# Patient Record
Sex: Male | Born: 1935 | Race: White | Marital: Married | Smoking: Never smoker
Health system: Southern US, Community
[De-identification: ages and names within clinical notes are randomized; demographics above are authoritative.]

## PROBLEM LIST (undated history)

## (undated) DIAGNOSIS — I10 Essential (primary) hypertension: Secondary | ICD-10-CM

## (undated) DIAGNOSIS — I509 Heart failure, unspecified: Secondary | ICD-10-CM

## (undated) DIAGNOSIS — E079 Disorder of thyroid, unspecified: Secondary | ICD-10-CM

## (undated) HISTORY — PX: FOOT FUSION: SHX956

## (undated) HISTORY — PX: CHOLECYSTECTOMY: SHX55

## (undated) HISTORY — PX: CATARACT EXTRACTION: SUR2

## (undated) HISTORY — PX: HEMORROIDECTOMY: SUR656

## (undated) HISTORY — PX: HERNIA REPAIR: SHX51

## (undated) HISTORY — PX: TONSILLECTOMY: SUR1361

## (undated) HISTORY — PX: PROSTATECTOMY: SHX69

---

## 2015-11-19 ENCOUNTER — Encounter (HOSPITAL_COMMUNITY): Payer: Self-pay

## 2015-11-19 ENCOUNTER — Emergency Department (HOSPITAL_COMMUNITY): Payer: Medicare HMO

## 2015-11-19 ENCOUNTER — Inpatient Hospital Stay (HOSPITAL_COMMUNITY)
Admission: EM | Admit: 2015-11-19 | Discharge: 2015-11-20 | DRG: 682 | Disposition: A | Discharge status: Home / Self Care | Payer: Medicare HMO | Attending: Internal Medicine | Admitting: Internal Medicine

## 2015-11-19 ENCOUNTER — Inpatient Hospital Stay (HOSPITAL_COMMUNITY)
Admit: 2015-11-19 | Discharge: 2015-11-19 | Disposition: A | Discharge status: Home / Self Care | Payer: Medicare HMO | Attending: Internal Medicine | Admitting: Internal Medicine

## 2015-11-19 DIAGNOSIS — Z66 Do not resuscitate: Secondary | ICD-10-CM | POA: Diagnosis present

## 2015-11-19 DIAGNOSIS — R609 Edema, unspecified: Secondary | ICD-10-CM | POA: Diagnosis not present

## 2015-11-19 DIAGNOSIS — J9811 Atelectasis: Secondary | ICD-10-CM | POA: Diagnosis present

## 2015-11-19 DIAGNOSIS — E860 Dehydration: Secondary | ICD-10-CM | POA: Diagnosis present

## 2015-11-19 DIAGNOSIS — N189 Chronic kidney disease, unspecified: Secondary | ICD-10-CM | POA: Diagnosis present

## 2015-11-19 DIAGNOSIS — K5900 Constipation, unspecified: Secondary | ICD-10-CM | POA: Diagnosis present

## 2015-11-19 DIAGNOSIS — E890 Postprocedural hypothyroidism: Secondary | ICD-10-CM | POA: Diagnosis present

## 2015-11-19 DIAGNOSIS — N179 Acute kidney failure, unspecified: Secondary | ICD-10-CM | POA: Diagnosis not present

## 2015-11-19 DIAGNOSIS — R4182 Altered mental status, unspecified: Secondary | ICD-10-CM | POA: Diagnosis present

## 2015-11-19 DIAGNOSIS — R7989 Other specified abnormal findings of blood chemistry: Secondary | ICD-10-CM | POA: Diagnosis not present

## 2015-11-19 DIAGNOSIS — Z79899 Other long term (current) drug therapy: Secondary | ICD-10-CM

## 2015-11-19 DIAGNOSIS — F0280 Dementia in other diseases classified elsewhere without behavioral disturbance: Secondary | ICD-10-CM | POA: Diagnosis present

## 2015-11-19 DIAGNOSIS — R778 Other specified abnormalities of plasma proteins: Secondary | ICD-10-CM | POA: Diagnosis present

## 2015-11-19 DIAGNOSIS — I130 Hypertensive heart and chronic kidney disease with heart failure and stage 1 through stage 4 chronic kidney disease, or unspecified chronic kidney disease: Secondary | ICD-10-CM | POA: Diagnosis present

## 2015-11-19 DIAGNOSIS — Z9049 Acquired absence of other specified parts of digestive tract: Secondary | ICD-10-CM

## 2015-11-19 DIAGNOSIS — G309 Alzheimer's disease, unspecified: Secondary | ICD-10-CM | POA: Diagnosis present

## 2015-11-19 DIAGNOSIS — E872 Acidosis, unspecified: Secondary | ICD-10-CM

## 2015-11-19 DIAGNOSIS — R000 Tachycardia, unspecified: Secondary | ICD-10-CM | POA: Diagnosis present

## 2015-11-19 DIAGNOSIS — Z7982 Long term (current) use of aspirin: Secondary | ICD-10-CM | POA: Diagnosis not present

## 2015-11-19 DIAGNOSIS — I482 Chronic atrial fibrillation: Secondary | ICD-10-CM | POA: Diagnosis present

## 2015-11-19 DIAGNOSIS — R748 Abnormal levels of other serum enzymes: Secondary | ICD-10-CM | POA: Diagnosis present

## 2015-11-19 DIAGNOSIS — G9340 Encephalopathy, unspecified: Secondary | ICD-10-CM

## 2015-11-19 DIAGNOSIS — Z951 Presence of aortocoronary bypass graft: Secondary | ICD-10-CM

## 2015-11-19 DIAGNOSIS — I2510 Atherosclerotic heart disease of native coronary artery without angina pectoris: Secondary | ICD-10-CM | POA: Diagnosis present

## 2015-11-19 DIAGNOSIS — I480 Paroxysmal atrial fibrillation: Secondary | ICD-10-CM | POA: Diagnosis not present

## 2015-11-19 DIAGNOSIS — M7989 Other specified soft tissue disorders: Secondary | ICD-10-CM

## 2015-11-19 DIAGNOSIS — R600 Localized edema: Secondary | ICD-10-CM

## 2015-11-19 DIAGNOSIS — Z9181 History of falling: Secondary | ICD-10-CM | POA: Diagnosis not present

## 2015-11-19 DIAGNOSIS — I10 Essential (primary) hypertension: Secondary | ICD-10-CM | POA: Diagnosis present

## 2015-11-19 DIAGNOSIS — R531 Weakness: Secondary | ICD-10-CM

## 2015-11-19 DIAGNOSIS — I509 Heart failure, unspecified: Secondary | ICD-10-CM | POA: Diagnosis present

## 2015-11-19 DIAGNOSIS — R410 Disorientation, unspecified: Secondary | ICD-10-CM

## 2015-11-19 HISTORY — DX: Heart failure, unspecified: I50.9

## 2015-11-19 HISTORY — DX: Disorder of thyroid, unspecified: E07.9

## 2015-11-19 HISTORY — DX: Essential (primary) hypertension: I10

## 2015-11-19 LAB — URINALYSIS, ROUTINE W REFLEX MICROSCOPIC
Bilirubin Urine: NEGATIVE
GLUCOSE, UA: NEGATIVE mg/dL
Hgb urine dipstick: NEGATIVE
Ketones, ur: NEGATIVE mg/dL
LEUKOCYTES UA: NEGATIVE
Nitrite: NEGATIVE
PH: 5 (ref 5.0–8.0)
PROTEIN: 30 mg/dL — AB
Specific Gravity, Urine: 1.025 (ref 1.005–1.030)

## 2015-11-19 LAB — CBC
HCT: 38.4 % — ABNORMAL LOW (ref 39.0–52.0)
Hemoglobin: 12.2 g/dL — ABNORMAL LOW (ref 13.0–17.0)
MCH: 31.9 pg (ref 26.0–34.0)
MCHC: 31.8 g/dL (ref 30.0–36.0)
MCV: 100.3 fL — AB (ref 78.0–100.0)
PLATELETS: 263 10*3/uL (ref 150–400)
RBC: 3.83 MIL/uL — ABNORMAL LOW (ref 4.22–5.81)
RDW: 14.3 % (ref 11.5–15.5)
WBC: 10.9 10*3/uL — ABNORMAL HIGH (ref 4.0–10.5)

## 2015-11-19 LAB — TROPONIN I
Troponin I: 1.27 ng/mL (ref <0.03)
Troponin I: 1.2 ng/mL (ref <0.03)

## 2015-11-19 LAB — COMPREHENSIVE METABOLIC PANEL
ALBUMIN: 3.5 g/dL (ref 3.5–5.0)
ALK PHOS: 124 U/L (ref 38–126)
ALT: 20 U/L (ref 17–63)
ANION GAP: 14 (ref 5–15)
AST: 33 U/L (ref 15–41)
BILIRUBIN TOTAL: 0.9 mg/dL (ref 0.3–1.2)
BUN: 32 mg/dL — ABNORMAL HIGH (ref 6–20)
CALCIUM: 9.2 mg/dL (ref 8.9–10.3)
CO2: 25 mmol/L (ref 22–32)
Chloride: 99 mmol/L — ABNORMAL LOW (ref 101–111)
Creatinine, Ser: 1.72 mg/dL — ABNORMAL HIGH (ref 0.61–1.24)
GFR calc non Af Amer: 33 mL/min — ABNORMAL LOW (ref >60)
GFR, EST AFRICAN AMERICAN: 38 mL/min — AB (ref >60)
Glucose, Bld: 141 mg/dL — ABNORMAL HIGH (ref 65–99)
POTASSIUM: 4.1 mmol/L (ref 3.5–5.1)
Sodium: 138 mmol/L (ref 135–145)
Total Protein: 7.5 g/dL (ref 6.5–8.1)

## 2015-11-19 LAB — I-STAT TROPONIN, ED: Troponin i, poc: 0.68 ng/mL (ref 0.00–0.08)

## 2015-11-19 LAB — LACTIC ACID, PLASMA
LACTIC ACID, VENOUS: 2.3 mmol/L — AB (ref 0.5–1.9)
LACTIC ACID, VENOUS: 2.6 mmol/L — AB (ref 0.5–1.9)

## 2015-11-19 LAB — URINE MICROSCOPIC-ADD ON

## 2015-11-19 LAB — TSH: TSH: 1.216 u[IU]/mL (ref 0.350–4.500)

## 2015-11-19 LAB — I-STAT CG4 LACTIC ACID, ED: Lactic Acid, Venous: 3.28 mmol/L (ref 0.5–1.9)

## 2015-11-19 LAB — BRAIN NATRIURETIC PEPTIDE: B Natriuretic Peptide: 492.6 pg/mL — ABNORMAL HIGH (ref 0.0–100.0)

## 2015-11-19 LAB — CBG MONITORING, ED: Glucose-Capillary: 134 mg/dL — ABNORMAL HIGH (ref 65–99)

## 2015-11-19 MED ORDER — LEVOTHYROXINE SODIUM 25 MCG PO TABS
125.0000 ug | ORAL_TABLET | Freq: Every day | ORAL | Status: DC
  Administered 2015-11-20: 125 ug via ORAL
  Filled 2015-11-19: qty 1

## 2015-11-19 MED ORDER — SODIUM CHLORIDE 0.9 % IV BOLUS (SEPSIS)
500.0000 mL | Freq: Once | INTRAVENOUS | Status: AC
  Administered 2015-11-19: 500 mL via INTRAVENOUS

## 2015-11-19 MED ORDER — ASPIRIN EC 81 MG PO TBEC
81.0000 mg | DELAYED_RELEASE_TABLET | Freq: Every day | ORAL | Status: DC
  Filled 2015-11-19: qty 1

## 2015-11-19 MED ORDER — LATANOPROST 0.005 % OP SOLN
1.0000 [drp] | Freq: Every day | OPHTHALMIC | Status: DC
  Administered 2015-11-19: 1 [drp] via OPHTHALMIC
  Filled 2015-11-19: qty 2.5

## 2015-11-19 MED ORDER — METOPROLOL TARTRATE 5 MG/5ML IV SOLN
2.5000 mg | Freq: Once | INTRAVENOUS | Status: DC
  Filled 2015-11-19: qty 5

## 2015-11-19 MED ORDER — POLYETHYLENE GLYCOL 3350 17 G PO PACK
8.5000 g | PACK | Freq: Every day | ORAL | Status: DC
  Administered 2015-11-19 – 2015-11-20 (×2): 8.5 g via ORAL
  Filled 2015-11-19 (×2): qty 1

## 2015-11-19 MED ORDER — METOPROLOL TARTRATE 50 MG PO TABS
50.0000 mg | ORAL_TABLET | Freq: Two times a day (BID) | ORAL | Status: DC
  Administered 2015-11-19 – 2015-11-20 (×2): 50 mg via ORAL
  Filled 2015-11-19 (×2): qty 1

## 2015-11-19 MED ORDER — ASPIRIN 81 MG PO CHEW
81.0000 mg | CHEWABLE_TABLET | Freq: Every day | ORAL | Status: DC
  Administered 2015-11-19 – 2015-11-20 (×2): 81 mg via ORAL
  Filled 2015-11-19 (×2): qty 1

## 2015-11-19 MED ORDER — SODIUM CHLORIDE 0.9 % IV SOLN
INTRAVENOUS | Status: DC
  Administered 2015-11-19: 12:00:00 via INTRAVENOUS

## 2015-11-19 MED ORDER — ENOXAPARIN SODIUM 30 MG/0.3ML ~~LOC~~ SOLN
30.0000 mg | SUBCUTANEOUS | Status: DC
  Administered 2015-11-19: 30 mg via SUBCUTANEOUS
  Filled 2015-11-19: qty 0.3

## 2015-11-19 MED ORDER — METOPROLOL TARTRATE 25 MG PO TABS
25.0000 mg | ORAL_TABLET | Freq: Once | ORAL | Status: AC
  Administered 2015-11-19: 25 mg via ORAL
  Filled 2015-11-19: qty 1

## 2015-11-19 NOTE — Consult Note (Signed)
Reason for Consult: Elevated Troponin   Referring Physician: Dr. Aris Georgia Alfrey is an 82 y.o. male.   HPI: The patient is a very pleasantly demented 82 year old man who was admitted to the hospital with dehydration and altered mentation. Subsequent laboratory evaluation demonstrated an elevated troponin. We are asked to see and evaluate. He denies anginal symptoms. He is not short of breath. He has chronic atrial fibrillation, but is not thought to be a candidate for systemic anticoagulation. He has a history of falls and dementia. The patient has improved since hospitalization with IV hydration. No shortness of breath. No syncope. No palpitations.  PMH: Past Medical History:  Diagnosis Date  . CHF (congestive heart failure) (HCC)   . Hypertension   . Thyroid disease     PSHX: Past Surgical History:  Procedure Laterality Date  . CATARACT EXTRACTION    . CHOLECYSTECTOMY    . FOOT FUSION    . HEMORROIDECTOMY    . HERNIA REPAIR    . PROSTATECTOMY    . TONSILLECTOMY      FAMHX:No family history on file. No premature coronary disease Social History:  reports that he has never smoked. He has never used smokeless tobacco. He reports that he does not drink alcohol or use drugs.  Allergies: No Known Allergies  Medications: I have reviewed the patient's current medications.  Dg Chest 2 View  Result Date: 11/19/2015 CLINICAL DATA:  Patient with altered mental status. Recent car travel. EXAM: CHEST  2 VIEW COMPARISON:  None. FINDINGS: Monitoring leads overlie the patient. Cardiomegaly. Tortuosity and calcification of the thoracic aorta. Biapical pleural parenchymal thickening, right greater than left. Small bilateral pleural effusions, left-greater-than-right with underlying pulmonary opacities. Mid thoracic spine degenerative changes. IMPRESSION: Small bilateral pleural effusions, left-greater-than-right with underlying opacities favored represent atelectasis. Infection not  excluded. Aortic vascular calcifications. Electronically Signed   By: Annia Belt M.D.   On: 11/19/2015 08:13    ROS  As stated in the HPI and negative for all other systems.  Physical Exam  Vitals:Blood pressure (!) 140/97, pulse (!) 107, temperature 97.6 F (36.4 C), temperature source Axillary, resp. rate (!) 21, height 5\' 8"  (1.727 m), weight 130 lb 3.2 oz (59.1 kg), SpO2 94 %.  Elderly appearing 82 year old man, NAD HEENT: Unremarkable Neck:  7 cm JVD, no thyromegally Back:  No CVA tenderness Lungs:  Clear except for rales in the bases. No wheezes or rhonchi. HEART:  Regular rate rhythm, no murmurs, no rubs, no clicks Abd:  Soft, positive bowel sounds, no organomegally, no rebound, no guarding Ext:  2 plus pulses, trace peripheral edema, no cyanosis, no clubbing Skin:  No rashes no nodules Neuro:  CN II through XII intact, motor grossly intact  ECG - atrial fibrillation with a rapid ventricular response and QRS widening  Labs - reviewed  Chest x-ray - reviewed - atelectasis is present bilaterally  Telemetry - atrial fibrillation with a controlled ventricular response  Assessment/Plan: 1. Elevated troponin - the patient has no signs or symptoms of an acute myocardial infarction and has no diagnostic ECG changes. At the present time, there is no additional evaluation needed. I would recommend low-dose aspirin and beta blocker therapy. Would not recommend an echo, a stress test, or heart catheterization as the patient is not symptomatic and we would make no additional changes to his treatment with his advanced age and other comorbidities with the data collected. 2. Atrial fibrillation - continue aspirin. He is not a candidate for systemic  anticoagulation. Monitor his heart rate and treat his rapid ventricular rates if any with beta blocker therapy. 3. Dementia - he is pleasantly demented and answers questions appropriately.   Sharlot GowdaGregg TaylorMD 11/19/2015, 2:45 PM

## 2015-11-19 NOTE — ED Notes (Signed)
RN AND DR.PLUNKETT NOTIFIED OF PATIENT'S TROPONIN OF 0.68 AND LACTIC ACID LEVEL OF 3.28

## 2015-11-19 NOTE — ED Notes (Signed)
Patient and wife are aware we need a urine specimen, patient given coffee.

## 2015-11-19 NOTE — ED Provider Notes (Addendum)
[facility]-EMERGENCY DEPT Provider Note   CSN: [id number] Arrival date & time: 11/19/15  4696     History   Chief Complaint Chief Complaint  Patient presents with  . Altered Mental Status    HPI Adam Ashley is a 82 y.o. male.  Patient is a 82 year old male with a history with his family for altered mental status. Patient is originally a resident of [place] and was currently in the car yesterday for 12 hours due to a mandatory evacuation. Patient's wife states they only stopped 3 times and had nothing to eat since breakfast because they were in bumper to bumper traffic. When they got to the hotel last night he seemed much more confused and refused to lay in the bed. He refused to take his medication or to eat. This morning symptoms were only worse and they decided to bring him here. Wife states at baseline he has dementia but is always is oriented to person and familiar people. He never refuses to eat or take his medication.  She also states that he is extremely generally weak today where she was unable to get him out of bed and get him to the bathroom. Normally he transfers and rides in a wheelchair. Today patient's only complaint is feeling weak. He denies nausea, shortness of breath, chest pain, abdominal pain or diarrhea.      Past Medical History:  Diagnosis Date  . CHF (congestive heart failure) (HCC)   . Hypertension   . Thyroid disease     There are no active problems to display for this patient.   Past Surgical History:  Procedure Laterality Date  . CATARACT EXTRACTION    . CHOLECYSTECTOMY    . FOOT FUSION    . HEMORROIDECTOMY    . HERNIA REPAIR    . PROSTATECTOMY    . TONSILLECTOMY         Home Medications    Prior to Admission medications   Not on File    Family History No family history on file.  Social History Social History  Substance Use Topics  . Smoking status: Never Smoker  . Smokeless tobacco: Never Used  . Alcohol use No      Allergies   Review of patient's allergies indicates no known allergies.   Review of Systems Review of Systems  All other systems reviewed and are negative.    Physical Exam Updated Vital Signs BP 134/94 (BP Location: Right Arm)   Pulse 117   Temp 97.9 F (36.6 C) (Oral)   Resp 24   Ht 5\' 7"  (1.702 m)   Wt 135 lb (61.2 kg)   SpO2 100%   BMI 21.14 kg/m   Physical Exam  Constitutional: He appears well-developed. He appears listless. He appears cachectic. No distress.  HENT:  Head: Normocephalic and atraumatic.  Mouth/Throat: Mucous membranes are dry.  Eyes: Conjunctivae and EOM are normal. Pupils are equal, round, and reactive to light.  Neck: Normal range of motion. Neck supple.  Cardiovascular: Intact distal pulses.  An irregularly irregular rhythm present. Tachycardia present.   No murmur heard. Pulmonary/Chest: Effort normal and breath sounds normal. No respiratory distress. He has no wheezes. He has no rales.  Abdominal: Soft. He exhibits no distension. There is no tenderness. There is no rebound and no guarding.  Musculoskeletal: Normal range of motion. He exhibits edema. He exhibits no tenderness.  2+ distal edema bilaterally to the mid shin  Neurological: He appears listless. No cranial nerve deficit or sensory deficit.  Oriented to person.  Moving all ext  Skin: Skin is warm and dry. No rash noted. No erythema.  Psychiatric: He has a normal mood and affect. His behavior is normal.  Nursing note and vitals reviewed.    ED Treatments / Results  Labs (all labs ordered are listed, but only abnormal results are displayed) Labs Reviewed  COMPREHENSIVE METABOLIC PANEL - Abnormal; Notable for the following:       Result Value   Chloride 99 (*)    Glucose, Bld 141 (*)    BUN 32 (*)    Creatinine, Ser 1.72 (*)    GFR calc non Af Amer 33 (*)    GFR calc Af Amer 38 (*)    All other components within normal limits  CBC - Abnormal; Notable for the following:     WBC 10.9 (*)    RBC 3.83 (*)    Hemoglobin 12.2 (*)    HCT 38.4 (*)    MCV 100.3 (*)    All other components within normal limits  CBG MONITORING, ED - Abnormal; Notable for the following:    Glucose-Capillary 134 (*)    All other components within normal limits  I-STAT TROPOININ, ED - Abnormal; Notable for the following:    Troponin i, poc 0.68 (*)    All other components within normal limits  I-STAT CG4 LACTIC ACID, ED - Abnormal; Notable for the following:    Lactic Acid, Venous 3.28 (*)    All other components within normal limits  URINALYSIS, ROUTINE W REFLEX MICROSCOPIC (NOT AT [facility])    EKG  EKG Interpretation  Date/Time:  Saturday November 19 2015 06:49:40 EDT Ventricular Rate:  121 PR Interval:    QRS Duration: 130 QT Interval:  364 QTC Calculation: 517 R Axis:   -44 Text Interpretation:  Atrial fibrillation Nonspecific IVCD with LAD No previous tracing Confirmed by Anitra LauthPLUNKETT  MD, Alphonzo LemmingsWHITNEY ([id number]) on 11/19/2015 7:07:03 AM       Radiology Dg Chest 2 View  Result Date: 11/19/2015 CLINICAL DATA:  Patient with altered mental status. Recent car travel. EXAM: CHEST  2 VIEW COMPARISON:  None. FINDINGS: Monitoring leads overlie the patient. Cardiomegaly. Tortuosity and calcification of the thoracic aorta. Biapical pleural parenchymal thickening, right greater than left. Small bilateral pleural effusions, left-greater-than-right with underlying pulmonary opacities. Mid thoracic spine degenerative changes. IMPRESSION: Small bilateral pleural effusions, left-greater-than-right with underlying opacities favored represent atelectasis. Infection not excluded. Aortic vascular calcifications. Electronically Signed   By: Annia Beltrew  Davis M.D.   On: 11/19/2015 08:13    Procedures Procedures (including critical care time)  Medications Ordered in ED Medications  metoprolol (LOPRESSOR) injection 2.5 mg (not administered)  sodium chloride 0.9 % bolus 500 mL (not administered)     Initial  Impression / Assessment and Plan / ED Course  I have reviewed the triage vital signs and the nursing notes.  Pertinent labs & imaging results that were available during my care of the patient were reviewed by me and considered in my medical decision making (see chart for details).  Clinical Course   Patient is a 82 year old male presenting today with family for altered mental status. He is originally from [place] and was in the car for 12 hours yesterday related to a mandatory evacuation. His wife states he has not had his medication in approximately 24 hours and is eaten only 1-2 bites of food. He is refusing to eat or take his medication and has been more confused. He does have a  baseline dementia but is never this bad. Patient states he just feels weak today but is in no acute distress. He denies shortness of breath or chest pain. Patient is tachycardic on exam today with atrial fibrillation. His wife states he has had that before but was not aware that he was in it regularly. Again tachycardia may be related to rebound as patient is supposed to be on metoprolol twice a day and has not taken any in 24 hours. He denies any abdominal pain and swelling in his distal extremities is slightly better than baseline per his wife. Currently blood pressure is normal 134/94. He is afebrile but slightly tachypnea. Concern for potential dehydration as patient has not had anything in the last 24 hours to eat or drink as well as rebound tachycardia from not having his metoprolol which may also be adding into his symptoms. Also patient's dementia may be worse due to the fact that they had to leave quickly and in a different place than normal.  CBC, CMP, troponin, chest x-ray, UA, d-dimer pending. Patient given gentle fluid. Also given 2.5 metoprolol IV to help with his tachycardia. EKG shows atrial fibrillation with an indeterminate conduction delay. Patient has never been here before we have no old to  compare.  9:10 AM Patient's lactate was found to be 3.28 today with a mild troponin leak 0.68 which is most likely from A. fib RVR. After 1 L of IV fluids patient seems to be improving. Heart rate improved to the low 100s and he was given his oral metoprolol. Blood pressure remained stable. May have mild AK I did a with a creatinine of 1.7 however unclear what his baseline is but wife states he's never had problems before. UA still pending but will admit patient for further care.  Final Clinical Impressions(s) / ED Diagnoses   Final diagnoses:  Delirium  Dehydration    New Prescriptions New Prescriptions   No medications on file     Gwyneth Sprout, MD 11/19/15 1610    Gwyneth Sprout, MD 11/19/15 [phone]

## 2015-11-19 NOTE — Progress Notes (Signed)
*  Preliminary Results* Bilateral lower extremity venous duplex completed. Study was technically limited due to edema and patient position. Visualized veins of bilateral lower extremities are negative for deep vein thrombosis. Bilateral lower extremity veins exhibit pulsatile flow, suggestive of possible elevated right sided heart pressure. There is evidence of a right Baker's cyst. No evidence of left Baker's cyst.  11/19/2015 11:43 AM Gertie FeyMichelle Haydan Mansouri, BS, RVT, RDCS, RDMS

## 2015-11-19 NOTE — Progress Notes (Signed)
CRITICAL VALUE ALERT  Critical value received:  Trop 1.27, Lactic 2.3  Date of notification: 11/19/15  Time of notification:  1330  Critical value read back:Yes.    Nurse who received alert:  Beaulah Corinana Lakeyn Dokken  MD notified (1st page):  Gherghe  Time of first page:  1330  MD notified (2nd page):  Time of second page:  Responding MD:  Elvera LennoxGherghe  Time MD responded:  1345

## 2015-11-19 NOTE — ED Triage Notes (Signed)
Patient presents today with wife who states that his mental status has become altered.  Patient and wife traveled in the car 12 hours yesterday during the mandatory evacuation.  Patient wife states that he was "normal" yesterday but, when they arrived at the hotel last night he was not sure where he was and that he wanted to leave.  Patient denies chest pain or SOB.
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
## 2015-11-20 DIAGNOSIS — E872 Acidosis, unspecified: Secondary | ICD-10-CM

## 2015-11-20 DIAGNOSIS — E860 Dehydration: Secondary | ICD-10-CM

## 2015-11-20 DIAGNOSIS — R7989 Other specified abnormal findings of blood chemistry: Secondary | ICD-10-CM

## 2015-11-20 DIAGNOSIS — I2510 Atherosclerotic heart disease of native coronary artery without angina pectoris: Secondary | ICD-10-CM

## 2015-11-20 DIAGNOSIS — I480 Paroxysmal atrial fibrillation: Secondary | ICD-10-CM

## 2015-11-20 DIAGNOSIS — G9340 Encephalopathy, unspecified: Secondary | ICD-10-CM

## 2015-11-20 DIAGNOSIS — N179 Acute kidney failure, unspecified: Principal | ICD-10-CM

## 2015-11-20 DIAGNOSIS — I10 Essential (primary) hypertension: Secondary | ICD-10-CM

## 2015-11-20 DIAGNOSIS — R531 Weakness: Secondary | ICD-10-CM

## 2015-11-20 DIAGNOSIS — R600 Localized edema: Secondary | ICD-10-CM

## 2015-11-20 LAB — CBC
HCT: 34 % — ABNORMAL LOW (ref 39.0–52.0)
Hemoglobin: 11.1 g/dL — ABNORMAL LOW (ref 13.0–17.0)
MCH: 32.3 pg (ref 26.0–34.0)
MCHC: 32.6 g/dL (ref 30.0–36.0)
MCV: 98.8 fL (ref 78.0–100.0)
PLATELETS: 224 10*3/uL (ref 150–400)
RBC: 3.44 MIL/uL — AB (ref 4.22–5.81)
RDW: 14.2 % (ref 11.5–15.5)
WBC: 8.9 10*3/uL (ref 4.0–10.5)

## 2015-11-20 LAB — URINE CULTURE: Culture: 1000 — AB

## 2015-11-20 LAB — BASIC METABOLIC PANEL
Anion gap: 8 (ref 5–15)
BUN: 28 mg/dL — AB (ref 6–20)
CALCIUM: 8.5 mg/dL — AB (ref 8.9–10.3)
CHLORIDE: 102 mmol/L (ref 101–111)
CO2: 27 mmol/L (ref 22–32)
CREATININE: 1.44 mg/dL — AB (ref 0.61–1.24)
GFR calc non Af Amer: 40 mL/min — ABNORMAL LOW (ref >60)
GFR, EST AFRICAN AMERICAN: 47 mL/min — AB (ref >60)
Glucose, Bld: 96 mg/dL (ref 65–99)
Potassium: 3.8 mmol/L (ref 3.5–5.1)
SODIUM: 137 mmol/L (ref 135–145)

## 2015-11-20 LAB — TROPONIN I: Troponin I: 1.03 ng/mL (ref <0.03)

## 2015-11-20 MED ORDER — T.E.D. BELOW KNEE/L-REGULAR MISC: 1.0000 | Freq: Every day | 0 refills | Status: AC

## 2015-11-20 MED ORDER — POTASSIUM CHLORIDE CRYS ER 20 MEQ PO TBCR: 20.0000 meq | EXTENDED_RELEASE_TABLET | Freq: Every day | ORAL | 0 refills | Status: AC | PRN

## 2015-11-20 MED ORDER — FUROSEMIDE 40 MG PO TABS: 40.0000 mg | ORAL_TABLET | Freq: Every day | ORAL | 0 refills | Status: AC

## 2015-11-20 NOTE — Discharge Instructions (Signed)
Try to avoid using Lasix unless swelling in legs severe. Wear compression stockings and keep legs elevated as much as possible to prevent swelling. Potassium should be taken only when you are taking Lasix.  Please take all your medications with you for your next visit with your Primary MD. Please request your Primary MD to go over all hospital test results at the follow up. Please ask your Primary MD to get all Hospital records sent to his/her office.  If you experience worsening of your admission symptoms, develop shortness of breath, chest pain, suicidal or homicidal thoughts or a life threatening emergency, you must seek medical attention immediately by calling 911 or calling your MD.  Bonita QuinYou must read the complete instructions/literature along with all the possible adverse reactions/side effects for all the medicines you take including new medications that have been prescribed to you. Take new medicines after you have completely understood and accpet all the possible adverse reactions/side effects.   Do not drive when taking pain medications or sedatives.    Do not take more than prescribed Pain, Sleep and Anxiety Medications  If you have smoked or chewed Tobacco in the last 2 yrs please stop. Stop any regular alcohol and or recreational drug use.  Wear Seat belts while driving.
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
## 2015-12-11 DEATH — deceased

## 2017-11-24 IMAGING — CR DG CHEST 2V
2 series · 2 of 2 positions shown · non-contrast
Comparison: None.

CLINICAL DATA: Patient with altered mental status. Recent car
travel.

EXAM:
CHEST  2 VIEW

[w chest lat]
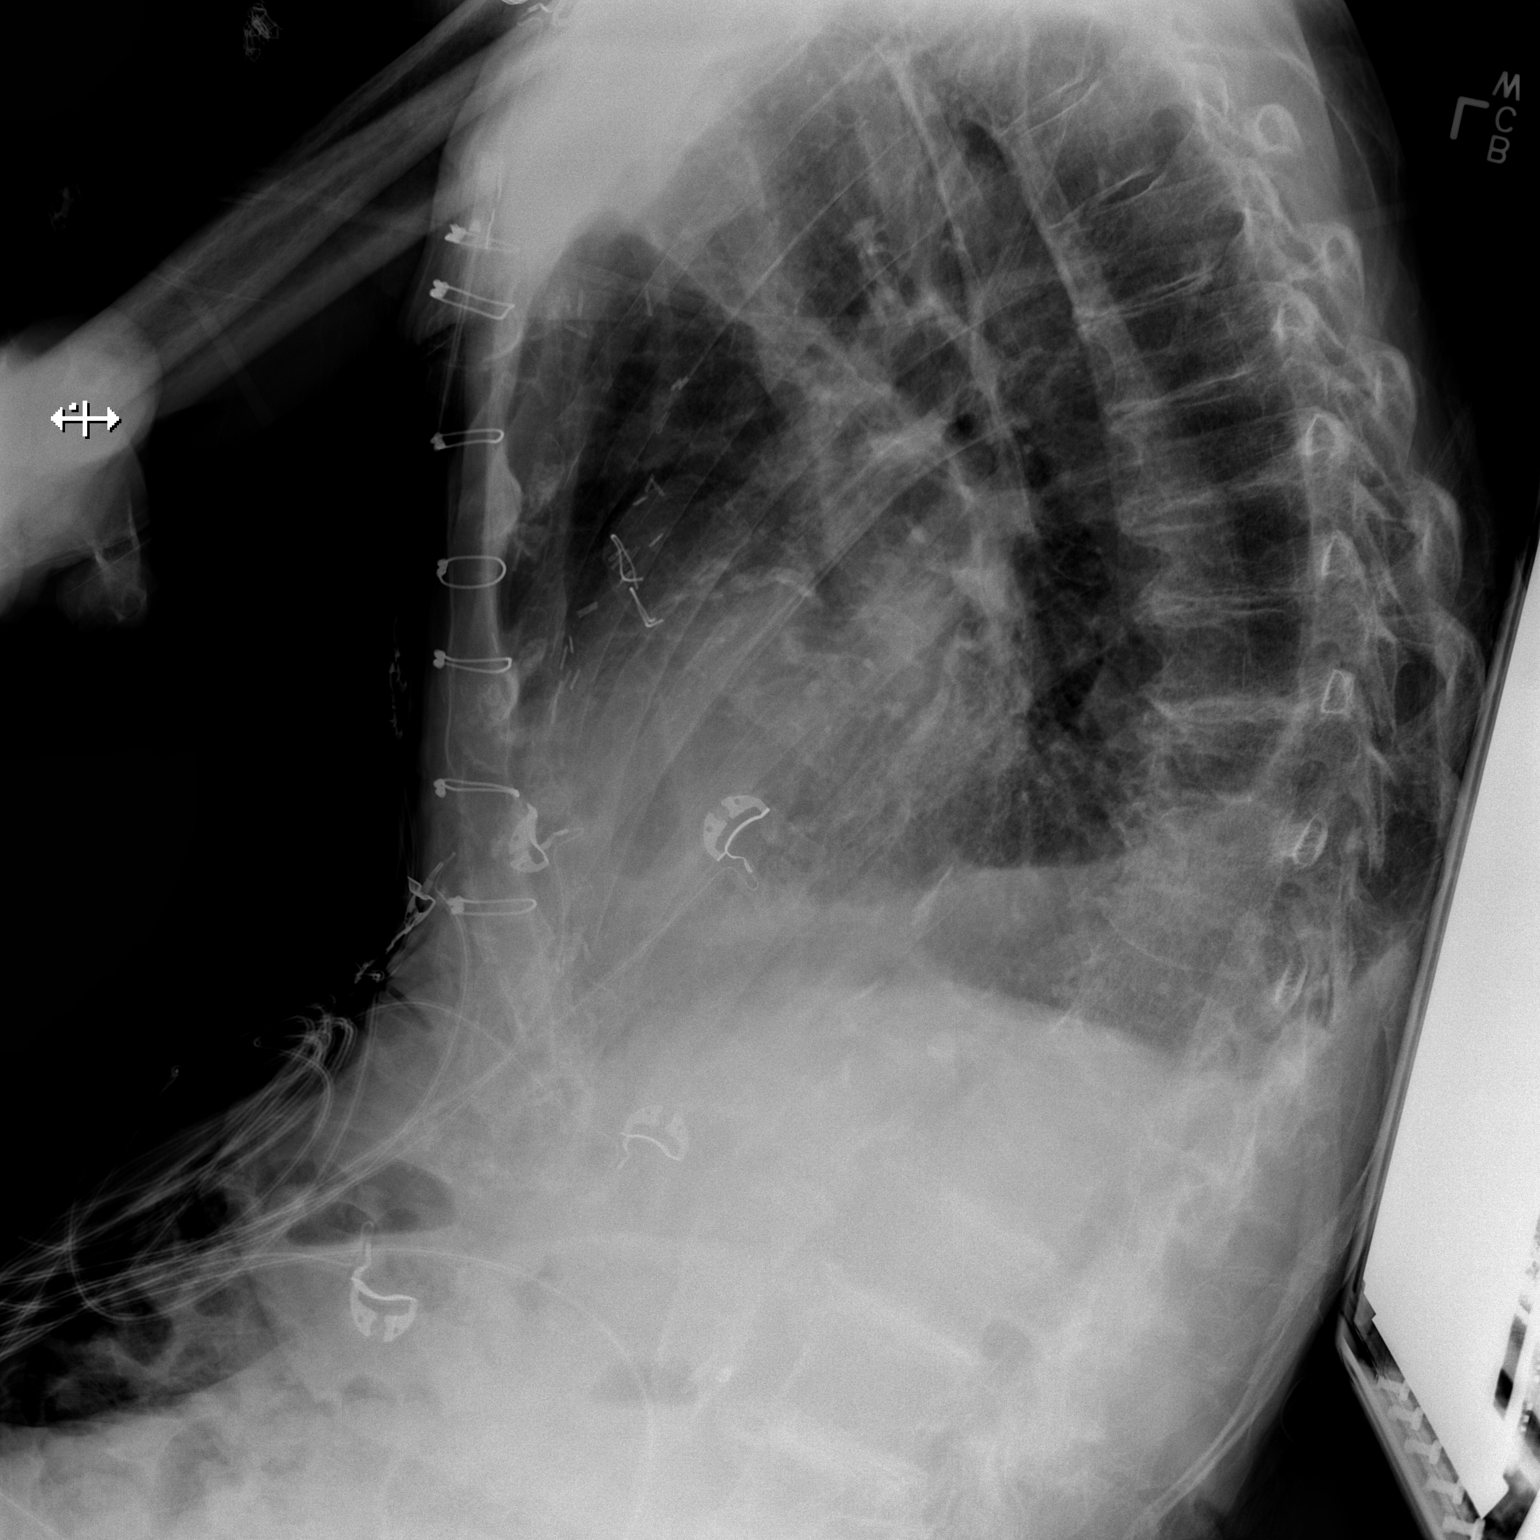

[x chest ap]
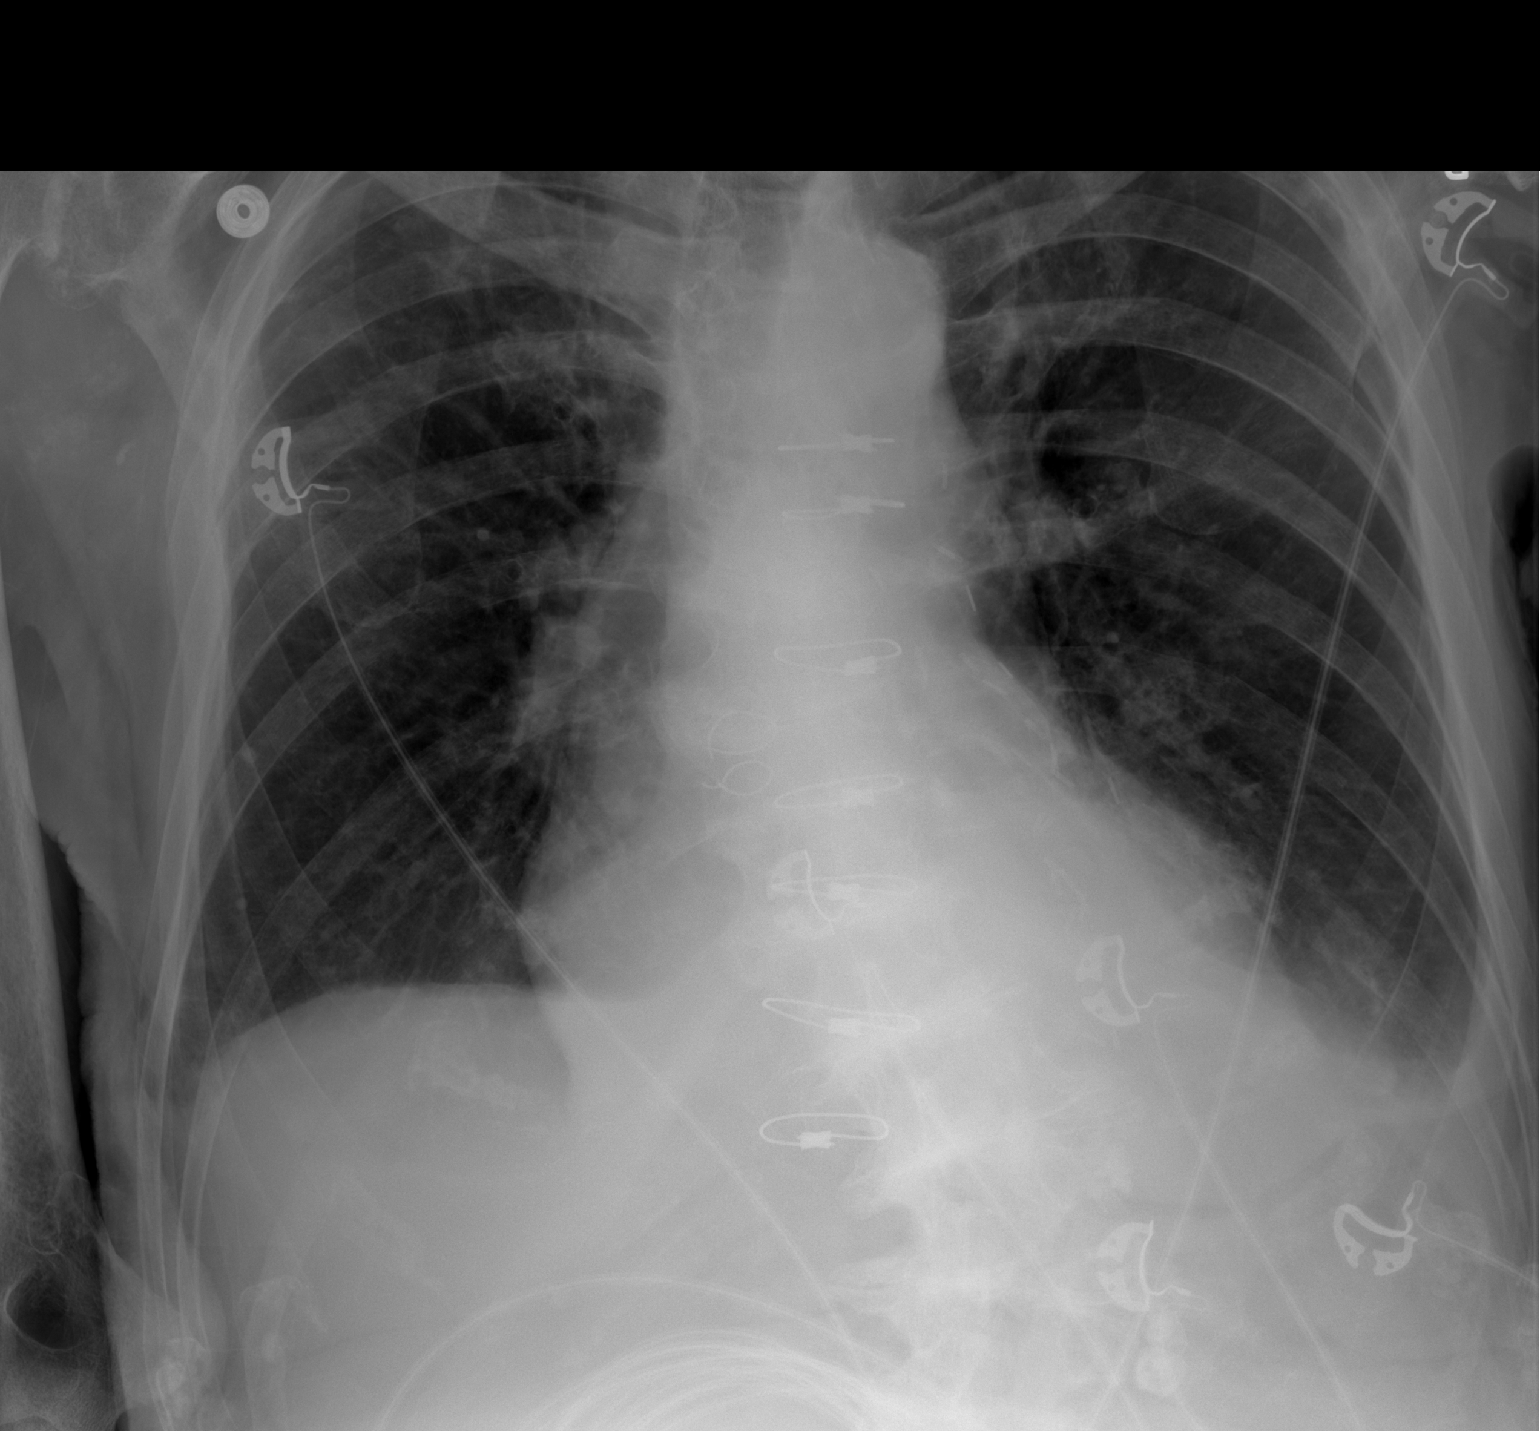

[2 of 2 positions shown; findings below may reference images not displayed]

FINDINGS: Monitoring leads overlie the patient. Cardiomegaly. Tortuosity and
calcification of the thoracic aorta. Biapical pleural parenchymal
thickening, right greater than left. Small bilateral pleural
effusions, left-greater-than-right with underlying pulmonary
opacities. Mid thoracic spine degenerative changes.
IMPRESSION: Small bilateral pleural effusions, left-greater-than-right with
underlying opacities favored represent atelectasis. Infection not
excluded.

Aortic vascular calcifications.
